# Patient Record
Sex: Male | Born: 1960 | Race: White | Hispanic: No | Marital: Single | Smoking: Current every day smoker
Health system: Southern US, Community
[De-identification: ages and names within clinical notes are randomized; demographics above are authoritative.]

## PROBLEM LIST (undated history)

## (undated) DIAGNOSIS — M109 Gout, unspecified: Secondary | ICD-10-CM

## (undated) HISTORY — DX: Gout, unspecified: M10.9

---

## 2010-03-13 ENCOUNTER — Emergency Department: Payer: Self-pay | Admitting: Emergency Medicine

## 2010-06-14 ENCOUNTER — Ambulatory Visit: Payer: Self-pay | Admitting: Otolaryngology

## 2010-10-18 ENCOUNTER — Ambulatory Visit: Payer: Self-pay | Admitting: Specialist

## 2012-06-11 ENCOUNTER — Emergency Department: Payer: Self-pay | Admitting: Unknown Physician Specialty

## 2012-06-11 LAB — URIC ACID: Uric Acid: 8.7 mg/dL — ABNORMAL HIGH (ref 3.5–7.2)

## 2013-04-19 IMAGING — CR RIGHT ANKLE - COMPLETE 3+ VIEW
1 series · 5 of 5 positions shown · non-contrast
Comparison: none

REASON FOR EXAM: pain,swelling
COMMENTS:   LMP: (Male)

PROCEDURE:     DXR - DXR ANKLE RIGHT COMPLETE  - June 11, 2012 [DATE]
RESULT:     Right ankle evaluation dated 06/11/2012

[Series 1: x ankle ap right · 0.14mm/px · 5 of 5 slices shown]
[im 1/5]
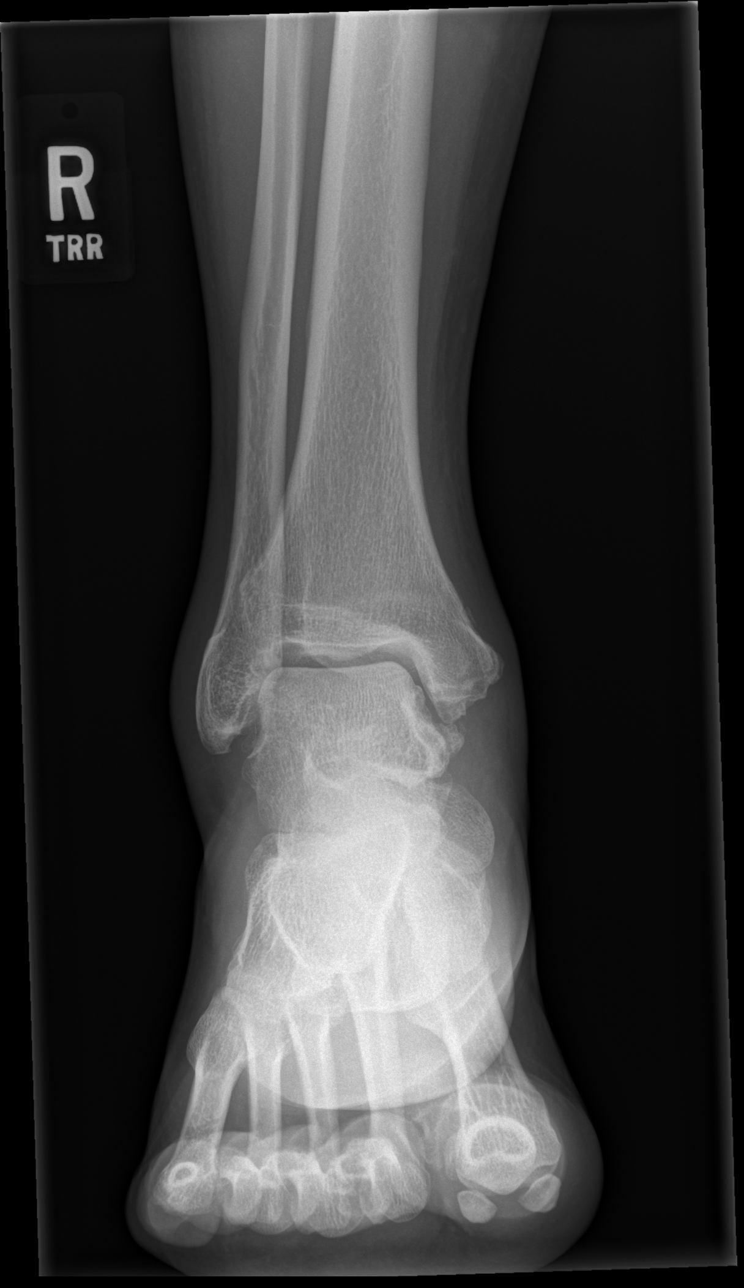
[im 2/5]
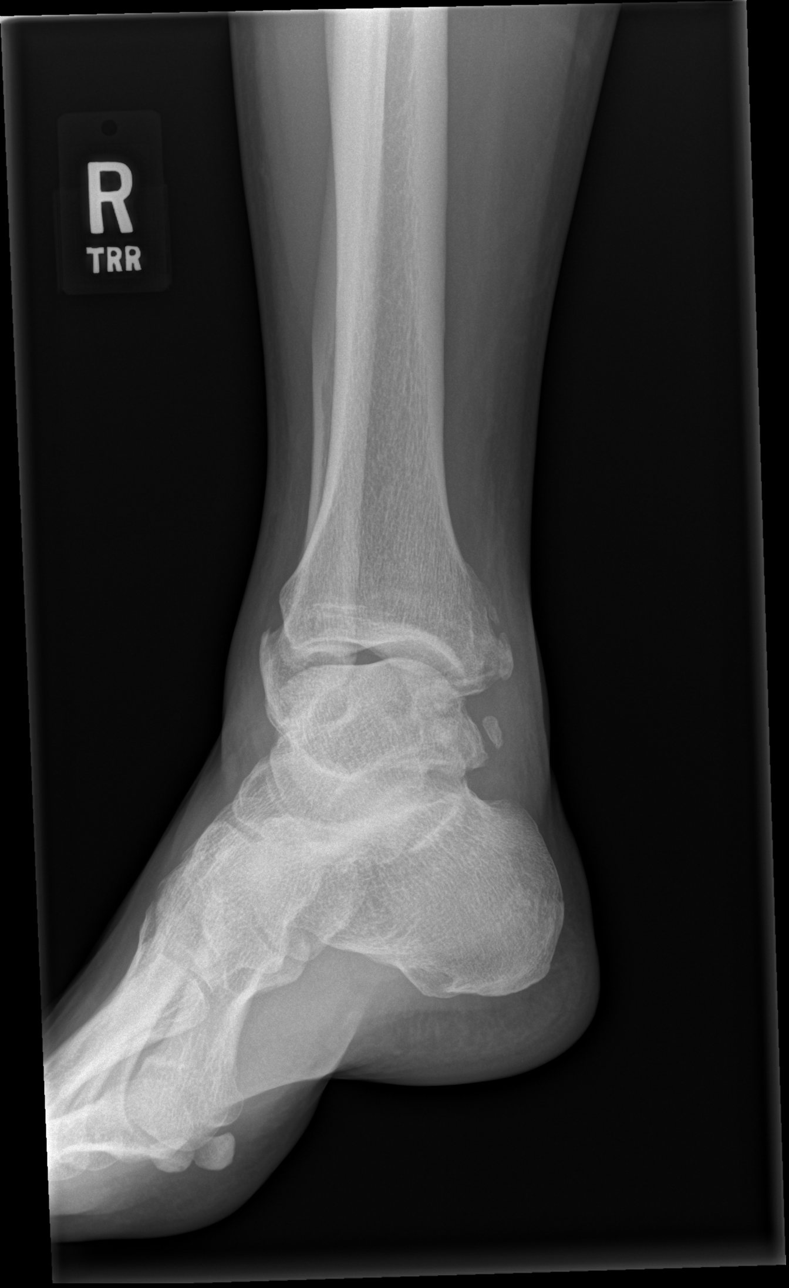
[im 3/5]
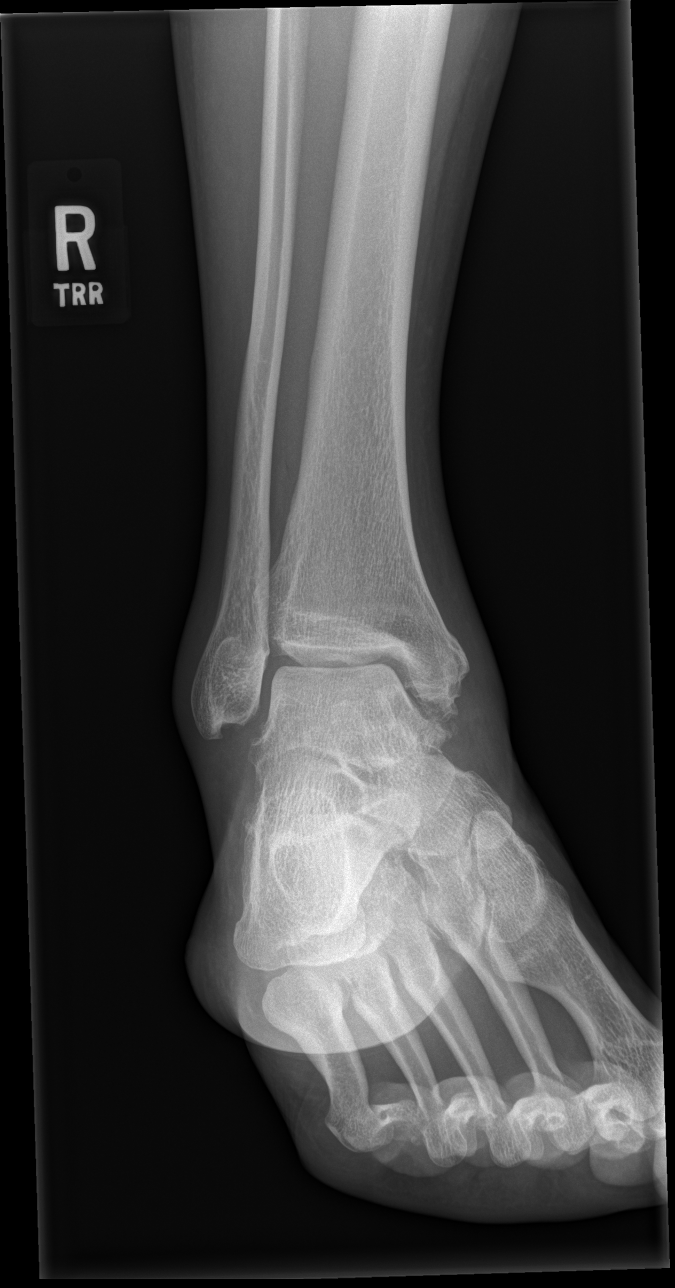
[im 4/5]
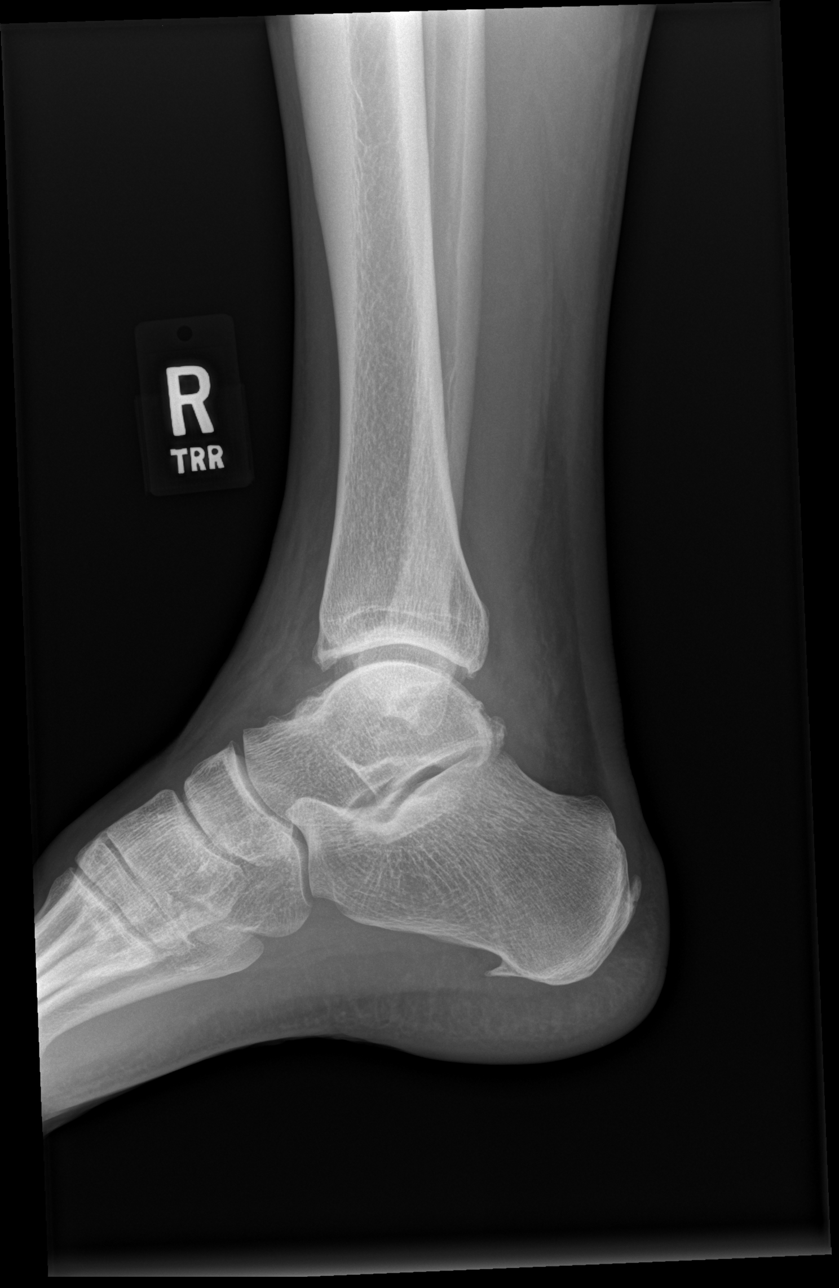
[im 5/5]
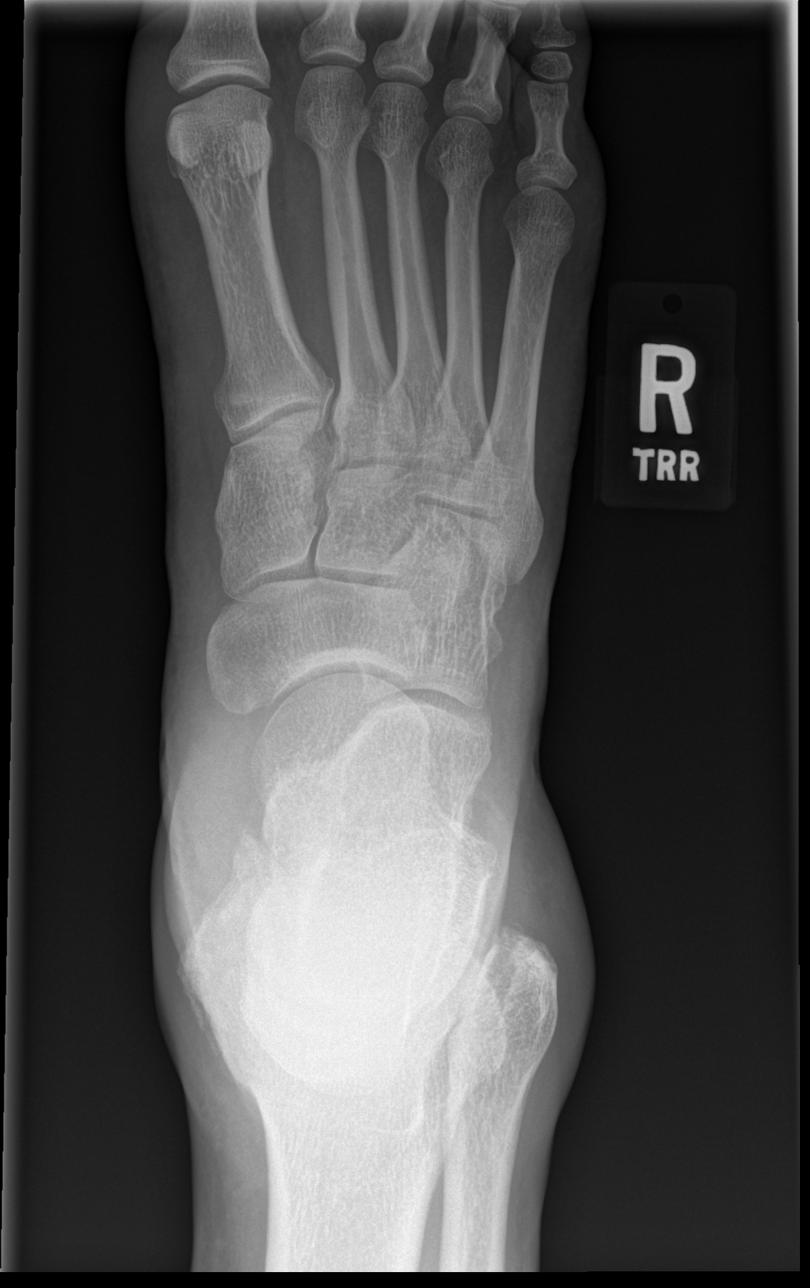

[5 of 5 positions shown; findings below may reference images not displayed]

FINDINGS: There is no radiographic evidence of acute fracture or
dislocation. Rounded corticated ossified densities project along the
posterior medial aspect of the ankle likely representing the sequela of
chronic avulsion injuries. There are areas of osteophytosis and subchondral
sclerosis particularly in the medial compartment of the ankle. The ankle
mortise is intact.
IMPRESSION: Chronic and osteoarthritic changes without evidence of
acute osseous abnormalities.

## 2014-11-22 ENCOUNTER — Ambulatory Visit (INDEPENDENT_AMBULATORY_CARE_PROVIDER_SITE_OTHER): Payer: BC Managed Care – PPO | Admitting: Podiatry

## 2014-11-22 ENCOUNTER — Encounter: Payer: Self-pay | Admitting: Podiatry

## 2014-11-22 VITALS — BP 150/91 | HR 66 | Resp 16 | Ht 69.0 in | Wt 190.0 lb

## 2014-11-22 DIAGNOSIS — L600 Ingrowing nail: Secondary | ICD-10-CM

## 2014-11-22 MED ORDER — NEOMYCIN-POLYMYXIN-HC 3.5-10000-1 OT SOLN: OTIC | Status: DC

## 2014-11-22 NOTE — Patient Instructions (Signed)

## 2014-11-22 NOTE — Progress Notes (Signed)
Both great toenails are ingrown medial corners. They seem to be getting worse over the past few months.  Objective: Vital signs are stable he is alert and oriented 3. Pulses are strongly palpable bilateral. Sharp incurvated nail margins along the tibial border of the hallux bilateral with hypertrophy of the skin along the medial border. Very tender on palpation and demonstrates signs of infection.  Assessment: Ingrown nail paronychia abscess hallux bilateral.  Plan: Discussed etiology pathology conservative versus surgical therapies. Ahead and performed a chemical matrixectomy to the tibial and fibular border of the hallux bilateral. This is performed under local anesthetic which was tolerated well. His [place] on written home-going instructions for soaking and application of Cortisporin otic at the time of his release. I will follow-up with him in 2 weeks

## 2014-12-11 ENCOUNTER — Ambulatory Visit (INDEPENDENT_AMBULATORY_CARE_PROVIDER_SITE_OTHER): Payer: BLUE CROSS/BLUE SHIELD | Admitting: Podiatry

## 2014-12-11 VITALS — BP 133/87 | HR 60 | Resp 16

## 2014-12-11 DIAGNOSIS — Z9889 Other specified postprocedural states: Secondary | ICD-10-CM

## 2014-12-11 NOTE — Progress Notes (Signed)
He presents today for follow-up matrixectomy's fibular border hallux bilateral. He states that they're a little tender but they seem to be doing quite well. He continues to soak in Epsom salts and warm water.  Objective: Vital signs are stable he is alert and oriented 3. Pulses are palpable. No erythema edema cellulitis drainage or odor.  Assessment: Well-healing surgical toes hallux bilateral fibular border.  Plan: Discontinue Betadine sterile with Epsom salts water soaks spelled in the daily but denies continue secondary to completely resolve.

## 2015-08-20 ENCOUNTER — Ambulatory Visit (INDEPENDENT_AMBULATORY_CARE_PROVIDER_SITE_OTHER): Payer: BLUE CROSS/BLUE SHIELD | Admitting: Podiatry

## 2015-08-20 ENCOUNTER — Encounter: Payer: Self-pay | Admitting: Podiatry

## 2015-08-20 ENCOUNTER — Ambulatory Visit (INDEPENDENT_AMBULATORY_CARE_PROVIDER_SITE_OTHER): Payer: BLUE CROSS/BLUE SHIELD

## 2015-08-20 VITALS — BP 149/75 | HR 68 | Resp 16

## 2015-08-20 DIAGNOSIS — M722 Plantar fascial fibromatosis: Secondary | ICD-10-CM

## 2015-08-20 MED ORDER — MELOXICAM 15 MG PO TABS: 15.0000 mg | ORAL_TABLET | Freq: Every day | ORAL | Status: DC

## 2015-08-20 MED ORDER — METHYLPREDNISOLONE 4 MG PO TBPK: ORAL_TABLET | ORAL | Status: DC

## 2015-08-20 NOTE — Patient Instructions (Signed)

## 2015-08-20 NOTE — Progress Notes (Signed)
He presents today with a chief complaint of bilateral heel pain. He states this didn't bother him for little more than a month now possibly as long as June or July. Particularly bad in the mornings after he's been sitting for a while and then gets back up to reactive ambulate. He states that after a few steps seems to go away until recently where doesn't seem to go away at all.  Objective: Vital signs are stable he is alert and oriented 3. Mild hypertension today and I suggested that he follow-up with his primary care provider. Pulses are palpable bilateral. He has pain on palpation medial calcaneal tubercles bilateral. 3 views radiographs in the office today demonstrates soft tissue increase in density at the plantar fascial calcaneal insertion site right greater than the left.  Assessment: Plantar fasciitis bilateral.  Plan: Injected the bilateral heels today with Kenalog and local and aesthetic placing the plantar fascial braces and the night splint. Start him on a Medrol Dosepak to be followed by meloxicam. We discussed appropriate shoe gear stretching exercises ice therapy and shoe modifications.

## 2015-09-19 ENCOUNTER — Encounter: Payer: Self-pay | Admitting: Podiatry

## 2015-09-19 ENCOUNTER — Ambulatory Visit (INDEPENDENT_AMBULATORY_CARE_PROVIDER_SITE_OTHER): Payer: BLUE CROSS/BLUE SHIELD | Admitting: Podiatry

## 2015-09-19 VITALS — BP 129/80 | HR 67 | Resp 16

## 2015-09-19 DIAGNOSIS — M722 Plantar fascial fibromatosis: Secondary | ICD-10-CM

## 2015-09-19 NOTE — Progress Notes (Signed)
He presents today for follow-up of his plantar fasciitis he states the left heel is a proximal 80% better and the right heel is approximately 50% improved.  Objective: Vital signs are stable alert and oriented 3. Pulses are strongly palpable. Neurologic sensorium is intact. He has pain on palpation medial calcaneal tubercles bilaterally.  Assessment: Plantar fasciitis bilateral.  Plan: Reinjected the bilateral heels today we'll follow-up with him in 4-6 weeks.  Arbutus Pedodd Collin Schaefer DPM

## 2015-10-31 ENCOUNTER — Ambulatory Visit: Payer: BLUE CROSS/BLUE SHIELD | Admitting: Podiatry

## 2015-12-24 ENCOUNTER — Encounter: Payer: Self-pay | Admitting: Podiatry

## 2015-12-24 ENCOUNTER — Ambulatory Visit (INDEPENDENT_AMBULATORY_CARE_PROVIDER_SITE_OTHER): Payer: BLUE CROSS/BLUE SHIELD | Admitting: Podiatry

## 2015-12-24 VITALS — BP 122/77 | HR 61 | Resp 16

## 2015-12-24 DIAGNOSIS — M722 Plantar fascial fibromatosis: Secondary | ICD-10-CM

## 2015-12-24 NOTE — Progress Notes (Signed)
He presents today for follow-up of plantar fasciitis. He states that it seems to be getting worse rather than better.   Objective: Vital signs are stable he is alert and oriented 3. Pulses are strongly palpable. Neurologic sensorium is intact per Semmes-Weinstein. Deep tendon reflexes are intact. Pain on palpation medial calcaneal tubercles bilateral.   Assessment: chronic intractable plantar fasciitis.  Plan: at this point I recommended orthotics. He was scanned today before he left as well as injected bilateral heels with Kenalog and local anesthetic.

## 2016-01-06 ENCOUNTER — Other Ambulatory Visit: Payer: Self-pay | Admitting: Podiatry

## 2016-01-15 ENCOUNTER — Telehealth: Payer: Self-pay | Admitting: Podiatry

## 2016-01-15 NOTE — Telephone Encounter (Signed)
Pt states he doesn't have 30 days left of the Meloxicam.  I told him he would not have 30 days left, since he has been taking them at one tablet a day, but once he completed them he had 2 refills.

## 2016-01-15 NOTE — Telephone Encounter (Signed)
01/15/2016- 3:54 pt called and asked for a refill on his Meloxicam  15 mg. Prescribed by Dr. Al Corpus. [facility] [address] [address]. Thanks !

## 2016-01-30 ENCOUNTER — Ambulatory Visit (INDEPENDENT_AMBULATORY_CARE_PROVIDER_SITE_OTHER): Payer: BLUE CROSS/BLUE SHIELD | Admitting: *Deleted

## 2016-01-30 DIAGNOSIS — M722 Plantar fascial fibromatosis: Secondary | ICD-10-CM

## 2016-01-30 NOTE — Progress Notes (Signed)
Dispensed patient's orthotics with oral and written instructions for wearing. Patient will follow up with Dr. Hyatt in 1 month for an orthotic check. 

## 2016-02-27 ENCOUNTER — Encounter: Payer: Self-pay | Admitting: Podiatry

## 2016-02-27 ENCOUNTER — Ambulatory Visit (INDEPENDENT_AMBULATORY_CARE_PROVIDER_SITE_OTHER): Payer: BLUE CROSS/BLUE SHIELD | Admitting: Podiatry

## 2016-02-27 VITALS — BP 137/83 | HR 57 | Resp 16

## 2016-02-27 DIAGNOSIS — M722 Plantar fascial fibromatosis: Secondary | ICD-10-CM

## 2016-02-27 NOTE — Progress Notes (Signed)
Collin HiddenGary presents today for follow-up of his bilateral plantar fasciitis and the use of his new orthotics. He states that his left foot is proximally 100% improved however he still has morning soreness in his right foot. He continues to take his anti-inflammatories on a regular basis and utilizes orthotics. He has questions today regarding shoe wear and boot wear for his particular job.  Objective: Vital signs are stable alert and oriented 3 much decrease in soreness on palpation medial calcaneal tubercles bilateral. Pulses are strongly palpable. No erythema edema cellulitis drainage or odor. No open lesions or wounds.  Assessment: Well-healing plantar fasciitis bilateral left greater than right.  Plan: Discussed etiology pathology conservative versus surgical therapies. At this point we  will continue his anti-inflammatory effect with this meant current medication and he will continue use of his orthotics. I will follow-up with him in approximately 6 weeks at which time if he is not doing better we will discuss surgical interventions. We discussed that briefly today and we discussed appropriate shoe gear and stretching exercises. Follow up with him in 6 weeks. No injection was performed today.

## 2016-03-20 ENCOUNTER — Other Ambulatory Visit: Payer: Self-pay | Admitting: Podiatry

## 2016-04-09 ENCOUNTER — Ambulatory Visit: Payer: BLUE CROSS/BLUE SHIELD | Admitting: Podiatry

## 2016-05-05 ENCOUNTER — Ambulatory Visit (INDEPENDENT_AMBULATORY_CARE_PROVIDER_SITE_OTHER): Payer: BLUE CROSS/BLUE SHIELD | Admitting: Podiatry

## 2016-05-05 ENCOUNTER — Encounter: Payer: Self-pay | Admitting: Podiatry

## 2016-05-05 DIAGNOSIS — M722 Plantar fascial fibromatosis: Secondary | ICD-10-CM

## 2016-05-05 MED ORDER — CELECOXIB 200 MG PO CAPS: 200.0000 mg | ORAL_CAPSULE | Freq: Two times a day (BID) | ORAL | Status: DC

## 2016-05-05 NOTE — Progress Notes (Signed)
He presents today for follow-up of his plantar fasciitis to the bilateral heels. He states that he seems to be doing much better than they are still somewhat tender.  Objective: Vital signs are stable alert and oriented 3. Pulses are palpable. Pain on palpation medial acute tubercles bilateral. No calf pain is noted.  Assessment: Plantar fasciitis bilateral.  Plan: Reinjected bilateral heels today follow-up with in one month if necessary. He also started him on Celebrex 200 mg 1 by mouth daily.

## 2016-05-09 ENCOUNTER — Telehealth: Payer: Self-pay | Admitting: *Deleted

## 2016-05-09 NOTE — Telephone Encounter (Signed)
[employer] [place] APPROVED CELECOXIB 200MG  KEY:  [id number], VALID 05/09/2016 - 11/30/2038. [facility]XED TO [phone].

## 2016-05-26 ENCOUNTER — Emergency Department: Payer: BLUE CROSS/BLUE SHIELD

## 2016-05-26 ENCOUNTER — Emergency Department
Admission: EM | Admit: 2016-05-26 | Discharge: 2016-05-26 | Disposition: A | Discharge status: Home / Self Care | Payer: BLUE CROSS/BLUE SHIELD | Attending: Emergency Medicine | Admitting: Emergency Medicine

## 2016-05-26 DIAGNOSIS — W01118A Fall on same level from slipping, tripping and stumbling with subsequent striking against other sharp object, initial encounter: Secondary | ICD-10-CM | POA: Diagnosis not present

## 2016-05-26 DIAGNOSIS — Y939 Activity, unspecified: Secondary | ICD-10-CM | POA: Insufficient documentation

## 2016-05-26 DIAGNOSIS — Y999 Unspecified external cause status: Secondary | ICD-10-CM | POA: Insufficient documentation

## 2016-05-26 DIAGNOSIS — Z8739 Personal history of other diseases of the musculoskeletal system and connective tissue: Secondary | ICD-10-CM | POA: Diagnosis not present

## 2016-05-26 DIAGNOSIS — S61431A Puncture wound without foreign body of right hand, initial encounter: Secondary | ICD-10-CM | POA: Insufficient documentation

## 2016-05-26 DIAGNOSIS — F17200 Nicotine dependence, unspecified, uncomplicated: Secondary | ICD-10-CM | POA: Diagnosis not present

## 2016-05-26 DIAGNOSIS — R2231 Localized swelling, mass and lump, right upper limb: Secondary | ICD-10-CM | POA: Diagnosis present

## 2016-05-26 DIAGNOSIS — Y9289 Other specified places as the place of occurrence of the external cause: Secondary | ICD-10-CM | POA: Diagnosis not present

## 2016-05-26 DIAGNOSIS — Z79899 Other long term (current) drug therapy: Secondary | ICD-10-CM | POA: Insufficient documentation

## 2016-05-26 MED ORDER — HYDROCODONE-ACETAMINOPHEN 5-325 MG PO TABS: 1.0000 | ORAL_TABLET | ORAL | Status: DC | PRN

## 2016-05-26 MED ORDER — NAPROXEN 500 MG PO TABS: 500.0000 mg | ORAL_TABLET | Freq: Two times a day (BID) | ORAL | Status: DC

## 2016-05-26 MED ORDER — TETANUS-DIPHTH-ACELL PERTUSSIS 5-2.5-18.5 LF-MCG/0.5 IM SUSP
0.5000 mL | Freq: Once | INTRAMUSCULAR | Status: AC
  Administered 2016-05-26: 0.5 mL via INTRAMUSCULAR
  Filled 2016-05-26: qty 0.5

## 2016-05-26 MED ORDER — AMOXICILLIN-POT CLAVULANATE 875-125 MG PO TABS: 1.0000 | ORAL_TABLET | Freq: Two times a day (BID) | ORAL | Status: DC

## 2016-05-26 MED ORDER — AMOXICILLIN-POT CLAVULANATE 875-125 MG PO TABS
1.0000 | ORAL_TABLET | Freq: Once | ORAL | Status: AC
  Administered 2016-05-26: 1 via ORAL
  Filled 2016-05-26: qty 1

## 2016-05-26 MED ORDER — NAPROXEN 500 MG PO TABS
500.0000 mg | ORAL_TABLET | Freq: Once | ORAL | Status: AC
  Administered 2016-05-26: 500 mg via ORAL
  Filled 2016-05-26: qty 1

## 2016-05-26 NOTE — ED Notes (Addendum)
Pt in via triage; pt reports slipping in garage and a screw driver puncturing his right hand.  Small laceration noted to right hand at back of thumb, area swollen, bleeding controlled at this time.  Pt A/Ox4, no immediate distress at this time.

## 2016-05-26 NOTE — ED Notes (Signed)

## 2016-05-26 NOTE — Discharge Instructions (Signed)
Puncture Wound A puncture wound is an injury that is caused by a sharp, thin object that goes through (penetrates) your skin. Usually, a puncture wound does not leave a large opening in your skin, so it may not bleed a lot. However, when you get a puncture wound, dirt or other materials (foreign bodies) can be forced into your wound and break off inside. This increases the chance of infection, such as tetanus. CAUSES Puncture wounds are caused by any sharp, thin object that goes through your skin, such as:  Animal teeth, as with an animal bite.  Sharp, pointed objects, such as nails, splinters of glass, fishhooks, and needles. SYMPTOMS Symptoms of a puncture wound include:  Pain.  Bleeding.  Swelling.  Bruising.  Fluid leaking from the wound.  Numbness, tingling, or loss of function. DIAGNOSIS This condition is diagnosed with a medical history and physical exam. Your wound will be checked to see if it contains any foreign bodies. You may also have X-rays or other imaging tests. TREATMENT Treatment for a puncture wound depends on how serious the wound is. It also depends on whether the wound contains any foreign bodies. Treatment for all types of puncture wounds usually starts with:  Controlling the bleeding.  Washing out the wound with a germ-free (sterile) salt-water solution.  Checking the wound for foreign bodies. Treatment may also include:  Having the wound opened surgically to remove a foreign object.  Closing the wound with stitches (sutures) if it continues to bleed.  Covering the wound with antibiotic ointments and a bandage (dressing).  Receiving a tetanus shot.  Receiving a rabies vaccine. HOME CARE INSTRUCTIONS Medicines  Take or apply over-the-counter and prescription medicines only as told by your health care provider.  If you were prescribed an antibiotic, take or apply it as told by your health care provider. Do not stop using the antibiotic even if  your condition improves. Wound Care  There are many ways to close and cover a wound. For example, a wound can be covered with sutures, skin glue, or adhesive strips. Follow instructions from your health care provider about:  How to take care of your wound.  When and how you should change your dressing.  When you should remove your dressing.  Removing whatever was used to close your wound.  Keep the dressing dry as told by your health care provider. Do not take baths, swim, use a hot tub, or do anything that would put your wound underwater until your health care provider approves.  Clean the wound as told by your health care provider.  Do not scratch or pick at the wound.  Check your wound every day for signs of infection. Watch for:  Redness, swelling, or pain.  Fluid, blood, or pus. General Instructions  Raise (elevate) the injured area above the level of your heart while you are sitting or lying down.  If your puncture wound is in your foot, ask your health care provider if you need to avoid putting weight on your foot and for how long.  Keep all follow-up visits as told by your health care provider. This is important. SEEK MEDICAL CARE IF:  You received a tetanus shot and you have swelling, severe pain, redness, or bleeding at the injection site.  You have a fever.  Your sutures come out.  You notice a bad smell coming from your wound or your dressing.  You notice something coming out of your wound, such as wood or glass.  Your   pain is not controlled with medicine.  You have increased redness, swelling, or pain at the site of your wound.  You have fluid, blood, or pus coming from your wound.  You notice a change in the color of your skin near your wound.  You need to change the dressing frequently due to fluid, blood, or pus draining from your wound.  You develop a new rash.  You develop numbness around your wound. SEEK IMMEDIATE MEDICAL CARE IF:  You  develop severe swelling around your wound.  Your pain suddenly increases and is severe.  You develop painful skin lumps.  You have a red streak going away from your wound.  The wound is on your hand or foot and you cannot properly move a finger or toe.  The wound is on your hand or foot and you notice that your fingers or toes look pale or bluish.   This information is not intended to replace advice given to you by your health care provider. Make sure you discuss any questions you have with your health care provider.   Document Released: 08/27/2005 Document Revised: 08/08/2015 Document Reviewed: 01/10/2015 Elsevier Interactive Patient Education 2016 [employer]  

## 2016-05-26 NOTE — ED Notes (Signed)
Pt was in his garage and fell has small lac to right hand with some abrasions.

## 2016-05-26 NOTE — ED Provider Notes (Signed)
[facility] Emergency Department Provider Note  ____________________________________________  Time seen: Approximately 10:15 PM  I have reviewed the triage vital signs and the nursing notes.   HISTORY  Chief Complaint Laceration   HPI Collin Schaefer is a 55 y.o. male who presents to the emergency department for evaluation of a puncture wound to his right hand. He states that he slipped in his garage and a screwdriver punctured his right hand. He states that his hand swelled up immediately and "blood was squirting everywhere." He also has a superficial laceration/abrasion to the posterior forearm. Bleeding is well-controlled. Tetanus shot is not up-to-date.  Past Medical History  Diagnosis Date  . Gout     There are no active problems to display for this patient.   No past surgical history on file.  Current Outpatient Rx  Name  Route  Sig  Dispense  Refill  . allopurinol (ZYLOPRIM) 100 MG tablet   Oral   Take by mouth.         Marland Kitchen. amoxicillin-clavulanate (AUGMENTIN) 875-125 MG tablet   Oral   Take 1 tablet by mouth 2 (two) times daily.   20 tablet   0   . celecoxib (CELEBREX) 200 MG capsule   Oral   Take 1 capsule (200 mg total) by mouth 2 (two) times daily.   30 capsule   3   . HYDROcodone-acetaminophen (NORCO/VICODIN) 5-325 MG tablet   Oral   Take 1 tablet by mouth every 4 (four) hours as needed.   12 tablet   0   . meloxicam (MOBIC) 15 MG tablet      TAKE 1 TABLET (15 MG TOTAL) BY MOUTH DAILY.   30 tablet   8   . naproxen (NAPROSYN) 500 MG tablet   Oral   Take 1 tablet (500 mg total) by mouth 2 (two) times daily with a meal.   30 tablet   0   . polyethylene glycol powder (GLYCOLAX/MIRALAX) powder      Take as directed for colon prep         . Red Yeast Rice Extract 600 MG CAPS   Oral   Take by mouth.           Allergies Review of patient's allergies indicates no known allergies.  No family history on  file.  Social History Social History  Substance Use Topics  . Smoking status: Current Every Day Smoker  . Smokeless tobacco: Not on file  . Alcohol Use: Not on file    Review of Systems  Constitutional: Negative for fever/chills Respiratory: Negative for shortness of breath. Musculoskeletal: Positive for pain. Skin: Positive for puncture wound and laceration Neurological: Negative for headaches, focal weakness or numbness. ____________________________________________   PHYSICAL EXAM:  VITAL SIGNS: ED Triage Vitals  Enc Vitals Group     BP 05/26/16 2046 129/86 mmHg     Pulse Rate 05/26/16 2046 85     Resp 05/26/16 2046 18     Temp 05/26/16 2046 97.6 F (36.4 C)     Temp Source 05/26/16 2046 Oral     SpO2 05/26/16 2046 100 %     Weight 05/26/16 2046 177 lb (80.287 kg)     Height 05/26/16 2046 5\' 9"  (1.753 m)     Head Cir --      Peak Flow --      Pain Score 05/26/16 2049 5     Pain Loc --      Pain Edu? --  Excl. in GC? --      Constitutional: Alert and oriented. Well appearing and in no acute distress. Eyes: Conjunctivae are normal. EOMI. Nose: No congestion/rhinnorhea. Mouth/Throat: Mucous membranes are moist.   Neck: No stridor. ardiovascular: Good peripheral circulation. Respiratory: Normal respiratory effort.  No retractions.  Musculoskeletal: FROM throughout. Swelling noted of the thenar eminence of the right hand. Neurologic:  Normal speech and language. No gross focal neurologic deficits are appreciated. Skin:  Puncture wound with active bleeding noted to the right hand on the dorsal aspect near the scaphoid. 1 cm superficial laceration/abrasion noted to the posterior forearm.  ____________________________________________   LABS (all labs ordered are listed, but only abnormal results are displayed)  Labs Reviewed - No data to  display ____________________________________________  EKG   ____________________________________________  RADIOLOGY  No evidence of radiopaque foreign body or fracture of the right hand per radiology.  I, Kem Boroughsari Jasminne Mealy, personally viewed and evaluated these images (plain radiographs) as part of my medical decision making, as well as reviewing the written report by the radiologist.   ____________________________________________   PROCEDURES  Procedure(s) performed:   Wound irrigated with NS. Surgicel applied under 2x2 pressure dressing and ACE bandage applied over all. Patient neurovascularly intact post application. ____________________________________________   INITIAL IMPRESSION / ASSESSMENT AND PLAN / ED COURSE  Pertinent labs & imaging results that were available during my care of the patient were reviewed by me and considered in my medical decision making (see chart for details).  He will be advised to take the Augmentin as prescribed and until finished. He is to take the Norco and Naprosyn as prescribed if needed. He is to follow up with orthopedics. He was advised to return to the emergency department for symptoms of concern if unable to see his primary care provider or orthopedics.   FINAL CLINICAL IMPRESSION(S) / ED DIAGNOSES  Final diagnoses:  Puncture wound, hand, right, initial encounter    New Prescriptions   AMOXICILLIN-CLAVULANATE (AUGMENTIN) 875-125 MG TABLET    Take 1 tablet by mouth 2 (two) times daily.   HYDROCODONE-ACETAMINOPHEN (NORCO/VICODIN) 5-325 MG TABLET    Take 1 tablet by mouth every 4 (four) hours as needed.   NAPROXEN (NAPROSYN) 500 MG TABLET    Take 1 tablet (500 mg total) by mouth 2 (two) times daily with a meal.     Chinita PesterCari B Rayquan Amrhein, FNP 05/26/16 2311  Phineas SemenGraydon Goodman, MD 05/26/16 2319

## 2016-07-07 ENCOUNTER — Ambulatory Visit: Payer: BLUE CROSS/BLUE SHIELD | Admitting: Podiatry

## 2016-12-02 ENCOUNTER — Other Ambulatory Visit: Payer: Self-pay | Admitting: Podiatry

## 2016-12-04 NOTE — Telephone Encounter (Signed)
Pt needs an appt if continuing to have problems. 

## 2016-12-20 ENCOUNTER — Other Ambulatory Visit: Payer: Self-pay | Admitting: Podiatry

## 2017-01-11 ENCOUNTER — Other Ambulatory Visit: Payer: Self-pay | Admitting: Podiatry

## 2017-05-27 ENCOUNTER — Telehealth: Payer: Self-pay | Admitting: Podiatry

## 2017-05-27 NOTE — Telephone Encounter (Signed)
Left message informing pt that he needed an appt prior refill, LOV 05/2016. Left message informing [id number] no [facility]ill [facility] for Meloxicam.

## 2017-05-27 NOTE — Telephone Encounter (Signed)
Hello, this is Estate manager/land agentean with [facility] in [place]. I'm calling about an prescription request we faxed for Meloxicam. I have not received any type of response. If you would, please call us back.

## 2017-10-05 ENCOUNTER — Ambulatory Visit: Payer: BLUE CROSS/BLUE SHIELD | Admitting: Podiatry

## 2017-10-12 ENCOUNTER — Ambulatory Visit: Payer: 59 | Admitting: Podiatry

## 2017-10-12 ENCOUNTER — Encounter: Payer: Self-pay | Admitting: Podiatry

## 2017-10-12 DIAGNOSIS — L603 Nail dystrophy: Secondary | ICD-10-CM

## 2017-10-12 DIAGNOSIS — M722 Plantar fascial fibromatosis: Secondary | ICD-10-CM

## 2017-10-12 MED ORDER — MELOXICAM 15 MG PO TABS: 15.0000 mg | ORAL_TABLET | Freq: Every day | ORAL | 3 refills | Status: DC

## 2017-10-12 NOTE — Progress Notes (Signed)
He presents today stating that it has been a while since I cut his ingrown toenails out now that I cut those out the nails seem to be growing funny. He states they really does not growing very much at all and is growing sideways.  Objective: Vital signs are stable alert and oriented 3. His nails do demonstrate a nail dystrophy distally though there is a growth line from medial to lateral about two thirds of the way out of the nail plate bilaterally.  Assessment: Nail dystrophy bilateral.  Plan: Samples of the skin and nail were taken today for pathologic evaluation we will notify him as to those results once they arrive.

## 2017-11-09 ENCOUNTER — Ambulatory Visit: Payer: Self-pay | Admitting: Podiatry

## 2019-05-18 ENCOUNTER — Ambulatory Visit: Payer: 59 | Admitting: Podiatry

## 2019-06-01 ENCOUNTER — Encounter: Payer: Self-pay | Admitting: Podiatry

## 2019-06-01 ENCOUNTER — Other Ambulatory Visit: Payer: Self-pay

## 2019-06-01 ENCOUNTER — Ambulatory Visit (INDEPENDENT_AMBULATORY_CARE_PROVIDER_SITE_OTHER): Payer: 59 | Admitting: Podiatry

## 2019-06-01 VITALS — Temp 99.1°F

## 2019-06-01 DIAGNOSIS — L600 Ingrowing nail: Secondary | ICD-10-CM | POA: Diagnosis not present

## 2019-06-01 DIAGNOSIS — M7700 Medial epicondylitis, unspecified elbow: Secondary | ICD-10-CM | POA: Insufficient documentation

## 2019-06-01 DIAGNOSIS — M2390 Unspecified internal derangement of unspecified knee: Secondary | ICD-10-CM | POA: Insufficient documentation

## 2019-06-01 MED ORDER — NEOMYCIN-POLYMYXIN-HC 1 % OT SOLN: OTIC | 1 refills | Status: AC

## 2019-06-01 NOTE — Patient Instructions (Signed)

## 2019-06-01 NOTE — Progress Notes (Signed)
He presents today chief complaint of ingrown toenails fibular borders of the hallux bilateral.  States that involving him for several weeks now.  Objective: Vital signs are stable he is alert and oriented x3.  Pulses are palpable.  There is no erythema cellulitis drainage or odor sharp incurvated fibular borders of the hallux nails are painful on palpation.  Assessment: Ingrown nail paronychia abscess hallux fibular border bilateral.  Plan: Chemical matrixectomy's were performed today he tolerated these procedures well after local anesthetic was administered.  Provided him with both oral and home-going instructions for care and soaking of his toes as well as a prescription for Cortisporin Otic to be applied twice daily after soaking.

## 2019-06-15 ENCOUNTER — Other Ambulatory Visit: Payer: Self-pay

## 2019-06-15 ENCOUNTER — Ambulatory Visit (INDEPENDENT_AMBULATORY_CARE_PROVIDER_SITE_OTHER): Payer: 59 | Admitting: Podiatry

## 2019-06-15 VITALS — Temp 97.0°F

## 2019-06-15 DIAGNOSIS — Z9889 Other specified postprocedural states: Secondary | ICD-10-CM

## 2019-06-15 DIAGNOSIS — L600 Ingrowing nail: Secondary | ICD-10-CM

## 2019-06-15 NOTE — Progress Notes (Signed)
He presents today for follow-up of his matrixectomy's hallux bilaterally.  He states they are doing good my left one still drains a little bit is little bit tender in the corner back here as he points to the fibular border of the hallux left.  Objective: Vital signs are stable he is alert and oriented x3 there is no erythema edema cellulitis drainage odor with exception of small amount to the proximal lateral nail fold of the hallux left where it is mildly tender.  No purulence is noted.  Assessment: Well-healing surgical toes.  Plan: Encouraged him to soak every other day Epson salts and warm water at least once a day apply Cortisporin otic to margins daily cover daily but leave open at bedtime.  Follow-up with him as needed
